# Patient Record
Sex: Male | Born: 2005 | Race: White | Hispanic: No | Marital: Single | Smoking: Never smoker
Health system: Southern US, Community
[De-identification: ages and names within clinical notes are randomized; demographics above are authoritative.]

## PROBLEM LIST (undated history)

## (undated) ENCOUNTER — Ambulatory Visit: Payer: Commercial Managed Care - PPO | Source: Home / Self Care

## (undated) DIAGNOSIS — Q605 Renal hypoplasia, unspecified: Secondary | ICD-10-CM

---
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
## 2024-01-12 ENCOUNTER — Other Ambulatory Visit: Payer: Self-pay | Admitting: Physician Assistant

## 2024-03-05 ENCOUNTER — Ambulatory Visit: Admitting: Cardiology
